# Patient Record
Sex: Male | Born: 1975 | Race: White | Hispanic: No | Marital: Single | Smoking: Never smoker
Health system: Southern US, Community
[De-identification: ages and names within clinical notes are randomized; demographics above are authoritative.]

---

## 1998-05-04 ENCOUNTER — Encounter: Payer: Self-pay | Admitting: Emergency Medicine

## 1998-05-04 ENCOUNTER — Emergency Department (HOSPITAL_COMMUNITY): Admission: EM | Admit: 1998-05-04 | Discharge: 1998-05-04 | Payer: Self-pay | Admitting: Emergency Medicine

## 1999-02-14 ENCOUNTER — Emergency Department (HOSPITAL_COMMUNITY): Admission: EM | Admit: 1999-02-14 | Discharge: 1999-02-14 | Payer: Self-pay

## 1999-08-06 ENCOUNTER — Emergency Department (HOSPITAL_COMMUNITY): Admission: EM | Admit: 1999-08-06 | Discharge: 1999-08-06 | Payer: Self-pay | Admitting: Emergency Medicine

## 2000-12-25 ENCOUNTER — Emergency Department (HOSPITAL_COMMUNITY): Admission: EM | Admit: 2000-12-25 | Discharge: 2000-12-25 | Payer: Self-pay | Admitting: Emergency Medicine

## 2000-12-25 ENCOUNTER — Encounter: Payer: Self-pay | Admitting: Emergency Medicine

## 2004-03-06 ENCOUNTER — Emergency Department (HOSPITAL_COMMUNITY): Admission: EM | Admit: 2004-03-06 | Discharge: 2004-03-06 | Payer: Self-pay | Admitting: Emergency Medicine
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
## 2014-07-05 ENCOUNTER — Encounter (HOSPITAL_COMMUNITY): Payer: Self-pay | Admitting: Emergency Medicine

## 2014-07-05 ENCOUNTER — Emergency Department (HOSPITAL_COMMUNITY)
Admission: EM | Admit: 2014-07-05 | Discharge: 2014-07-05 | Disposition: A | Discharge status: Home / Self Care | Payer: BC Managed Care – PPO | Attending: Emergency Medicine | Admitting: Emergency Medicine

## 2014-07-05 DIAGNOSIS — Z23 Encounter for immunization: Secondary | ICD-10-CM | POA: Diagnosis not present

## 2014-07-05 DIAGNOSIS — Y9289 Other specified places as the place of occurrence of the external cause: Secondary | ICD-10-CM | POA: Insufficient documentation

## 2014-07-05 DIAGNOSIS — Y998 Other external cause status: Secondary | ICD-10-CM | POA: Insufficient documentation

## 2014-07-05 DIAGNOSIS — W01198A Fall on same level from slipping, tripping and stumbling with subsequent striking against other object, initial encounter: Secondary | ICD-10-CM | POA: Insufficient documentation

## 2014-07-05 DIAGNOSIS — Y9389 Activity, other specified: Secondary | ICD-10-CM | POA: Insufficient documentation

## 2014-07-05 DIAGNOSIS — IMO0002 Reserved for concepts with insufficient information to code with codable children: Secondary | ICD-10-CM

## 2014-07-05 DIAGNOSIS — S0531XA Ocular laceration without prolapse or loss of intraocular tissue, right eye, initial encounter: Secondary | ICD-10-CM | POA: Insufficient documentation

## 2014-07-05 MED ORDER — TETANUS-DIPHTH-ACELL PERTUSSIS 5-2.5-18.5 LF-MCG/0.5 IM SUSP
0.5000 mL | Freq: Once | INTRAMUSCULAR | Status: AC
  Administered 2014-07-05: 0.5 mL via INTRAMUSCULAR
  Filled 2014-07-05: qty 0.5

## 2014-07-05 MED ORDER — LIDOCAINE-EPINEPHRINE (PF) 2 %-1:200000 IJ SOLN
10.0000 mL | Freq: Once | INTRAMUSCULAR | Status: AC
  Administered 2014-07-05: 10 mL
  Filled 2014-07-05: qty 20

## 2014-07-05 NOTE — ED Provider Notes (Signed)
CSN: [id number]     Arrival date & time 07/05/14  1504 History   First MD Initiated Contact with Patient 07/05/14 1557    This chart was scribed for non-physician practitioner, Arthor CaptainAbigail Dinisha Cai, working with Gerhard Munchobert Lockwood, MD by Marica OtterNusrat Rahman, ED Scribe. This patient was seen in room WTR7/WTR7 and the patient's care was started at 4:07 PM.  Chief Complaint  Patient presents with  . Head Laceration   The history is provided by the patient. No language interpreter was used.   PCP: No PCP Per Patient HPI Comments: Chase Fleming is a 38 y.o. male who presents to the Emergency Department complaining of traumatic, sudden onset head laceration sustained 2 hours ago when pt slipped and hit his head on a door. Pt reports immediately following the injury he was dizzy, however, the dizziness has now resolved. Pt denies LOC. Pt reports he is not UTD on his tetanus vaccine.   History reviewed. No pertinent past medical history. History reviewed. No pertinent past surgical history. No family history on file. History  Substance Use Topics  . Smoking status: Never Smoker   . Smokeless tobacco: Not on file  . Alcohol Use: No    Review of Systems  Constitutional: Negative for fever and chills.  Skin: Positive for wound (laceration above right eye).  Neurological: Negative for dizziness.  Psychiatric/Behavioral: Negative for confusion.      Allergies  Review of patient's allergies indicates not on file.  Home Medications   Prior to Admission medications   Not on File   Triage Vitals: BP 122/70 mmHg  Pulse 63  Temp(Src) 97.7 F (36.5 C) (Oral)  Resp 18  SpO2 100% Physical Exam  Constitutional: He is oriented to person, place, and time. He appears well-developed and well-nourished. No distress.  HENT:  Head: Normocephalic and atraumatic.  Eyes: Conjunctivae and EOM are normal.  Neck: Neck supple.  Pulmonary/Chest: Effort normal. No respiratory distress.  Musculoskeletal: Normal range  of motion.  Neurological: He is alert and oriented to person, place, and time.  Skin: Skin is warm and dry.  superficial 2cm elliptical laceration above right eye  Psychiatric: He has a normal mood and affect. His behavior is normal.  Nursing note and vitals reviewed.  LACERATION REPAIR Performed by: Arthor CaptainAbigail Karlynn Furrow Consent: Verbal consent obtained. Risks and benefits: risks, benefits and alternatives were discussed Patient identity confirmed: provided demographic data Time out performed prior to procedure Prepped and Draped in normal sterile fashion Wound explored Laceration Location: Above right eye Laceration Length: 2 cm elliptical laceration No Foreign Bodies seen or palpated Anesthesia: local infiltration Local anesthetic: lidocaine 2% with epinephrine Anesthetic total: 2 ml Irrigation method: syringe Amount of cleaning: standard Skin closure: 6-0 prolene suture Number of sutures or staples: 3 stitches Technique: simple interrupted Patient tolerance: Patient tolerated the procedure well with no immediate complications.  ED Course  Procedures (including critical care time) DIAGNOSTIC STUDIES: Oxygen Saturation is 100% on RA, nl by my interpretation.    COORDINATION OF CARE: 4:09 PM-Discussed treatment plan which includes laceration repair and tetanus shot with pt at bedside and pt agreed to plan.   Labs Review Labs Reviewed - No data to display  Imaging Review No results found.   EKG Interpretation None      MDM   Final diagnoses:  None    Tdap booster given.Pressure irrigation performed. Laceration occurred < 8 hours prior to repair which was well tolerated. Pt has no co morbidities to effect normal wound healing. Discussed  suture home care w pt and answered questions. Pt to f-u for wound check and suture removal in 7 days. Pt is hemodynamically stable w no complaints prior to dc.     I personally performed the services described in this documentation,  which was scribed in my presence. The recorded information has been reviewed and is accurate.      Arthor Captainbigail Rayn Enderson, PA-C 07/05/14 1654  Gerhard Munchobert Lockwood, MD 07/05/14 2101

## 2014-07-05 NOTE — Discharge Instructions (Signed)

## 2014-07-05 NOTE — ED Notes (Signed)
Per pt, states he slipped and hit his head on door-small laceration above right eye
